# Patient Record
Sex: Male | Born: 1955 | Hispanic: No | Marital: Married | Smoking: Never smoker
Health system: Southern US, Community
[De-identification: ages and names within clinical notes are randomized; demographics above are authoritative.]

## PROBLEM LIST (undated history)

## (undated) DIAGNOSIS — E785 Hyperlipidemia, unspecified: Secondary | ICD-10-CM

## (undated) HISTORY — DX: Hyperlipidemia, unspecified: E78.5

---

## 2001-07-12 ENCOUNTER — Ambulatory Visit (HOSPITAL_BASED_OUTPATIENT_CLINIC_OR_DEPARTMENT_OTHER): Admission: RE | Admit: 2001-07-12 | Discharge: 2001-07-12 | Payer: Self-pay | Admitting: *Deleted

## 2013-06-12 ENCOUNTER — Ambulatory Visit (INDEPENDENT_AMBULATORY_CARE_PROVIDER_SITE_OTHER): Payer: BC Managed Care – PPO | Admitting: Internal Medicine

## 2013-06-12 ENCOUNTER — Encounter: Payer: Self-pay | Admitting: Internal Medicine

## 2013-06-12 VITALS — BP 142/80 | HR 72 | Temp 98.6°F | Ht 70.0 in | Wt 201.0 lb

## 2013-06-12 DIAGNOSIS — S2249XA Multiple fractures of ribs, unspecified side, initial encounter for closed fracture: Secondary | ICD-10-CM

## 2013-06-12 DIAGNOSIS — J9819 Other pulmonary collapse: Secondary | ICD-10-CM

## 2013-06-12 DIAGNOSIS — J9811 Atelectasis: Secondary | ICD-10-CM

## 2013-06-12 MED ORDER — OXYCODONE-ACETAMINOPHEN 10-325 MG PO TABS: 1.0000 | ORAL_TABLET | Freq: Four times a day (QID) | ORAL | Status: DC | PRN

## 2013-06-12 NOTE — Patient Instructions (Signed)
Take enough percocet to make pain tolerable  Please schedule a follow up office visit in 2  weeks, sooner if needed for short of breath or worse pain
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
## 2013-06-28 ENCOUNTER — Ambulatory Visit (INDEPENDENT_AMBULATORY_CARE_PROVIDER_SITE_OTHER)
Admission: RE | Admit: 2013-06-28 | Discharge: 2013-06-28 | Disposition: A | Discharge status: Home / Self Care | Payer: BC Managed Care – PPO | Source: Ambulatory Visit | Attending: Internal Medicine | Admitting: Internal Medicine

## 2013-06-28 ENCOUNTER — Encounter: Payer: Self-pay | Admitting: Internal Medicine

## 2013-06-28 ENCOUNTER — Ambulatory Visit (INDEPENDENT_AMBULATORY_CARE_PROVIDER_SITE_OTHER): Payer: BC Managed Care – PPO | Admitting: Internal Medicine

## 2013-06-28 VITALS — BP 140/84 | HR 92 | Temp 97.9°F | Ht 70.0 in | Wt 198.4 lb

## 2013-06-28 DIAGNOSIS — S2249XA Multiple fractures of ribs, unspecified side, initial encounter for closed fracture: Secondary | ICD-10-CM

## 2013-06-28 MED ORDER — OXYCODONE-ACETAMINOPHEN 10-325 MG PO TABS: 1.0000 | ORAL_TABLET | Freq: Four times a day (QID) | ORAL | Status: DC | PRN
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
## 2013-06-28 NOTE — Patient Instructions (Signed)
Please remember to go to the  x-ray department downstairs for your tests - we will call you with the results when they are available.  Zostrix cream apply four times daily to area where you hurt and gradually reduce to bedtime as you improve and don't need the percocet  Please schedule a follow up office visit in 4 weeks, sooner if needed

## 2013-06-28 NOTE — Progress Notes (Signed)
Quick Note:  Spoke with pt and notified of results per Dr. Wert. Pt verbalized understanding and denied any questions.  ______ 
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
## 2013-07-25 ENCOUNTER — Ambulatory Visit (INDEPENDENT_AMBULATORY_CARE_PROVIDER_SITE_OTHER): Payer: BC Managed Care – PPO | Admitting: Internal Medicine

## 2013-07-25 ENCOUNTER — Encounter: Payer: Self-pay | Admitting: Internal Medicine

## 2013-07-25 ENCOUNTER — Ambulatory Visit (INDEPENDENT_AMBULATORY_CARE_PROVIDER_SITE_OTHER)
Admission: RE | Admit: 2013-07-25 | Discharge: 2013-07-25 | Disposition: A | Discharge status: Home / Self Care | Payer: BC Managed Care – PPO | Source: Ambulatory Visit | Attending: Internal Medicine | Admitting: Internal Medicine

## 2013-07-25 VITALS — BP 136/82 | HR 88 | Temp 98.0°F | Ht 70.0 in | Wt 194.0 lb

## 2013-07-25 DIAGNOSIS — J9819 Other pulmonary collapse: Secondary | ICD-10-CM

## 2013-07-25 DIAGNOSIS — J9811 Atelectasis: Secondary | ICD-10-CM

## 2013-07-25 DIAGNOSIS — S2241XK Multiple fractures of ribs, right side, subsequent encounter for fracture with nonunion: Secondary | ICD-10-CM

## 2013-07-25 DIAGNOSIS — IMO0002 Reserved for concepts with insufficient information to code with codable children: Secondary | ICD-10-CM
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
## 2013-07-25 NOTE — Patient Instructions (Addendum)
Avoid high impact activities, further xrays optional
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
## 2013-07-25 NOTE — Assessment & Plan Note (Signed)
resolved 

## 2014-10-14 DIAGNOSIS — M48061 Spinal stenosis, lumbar region without neurogenic claudication: Secondary | ICD-10-CM | POA: Insufficient documentation

## 2015-01-01 IMAGING — CR DG CHEST 2V
2 series · 2 of 2 positions shown · non-contrast
Comparison: 06/28/2013

CLINICAL DATA: Chest pain

EXAM:
CHEST  2 VIEW

[view not recorded (1 of 2)]
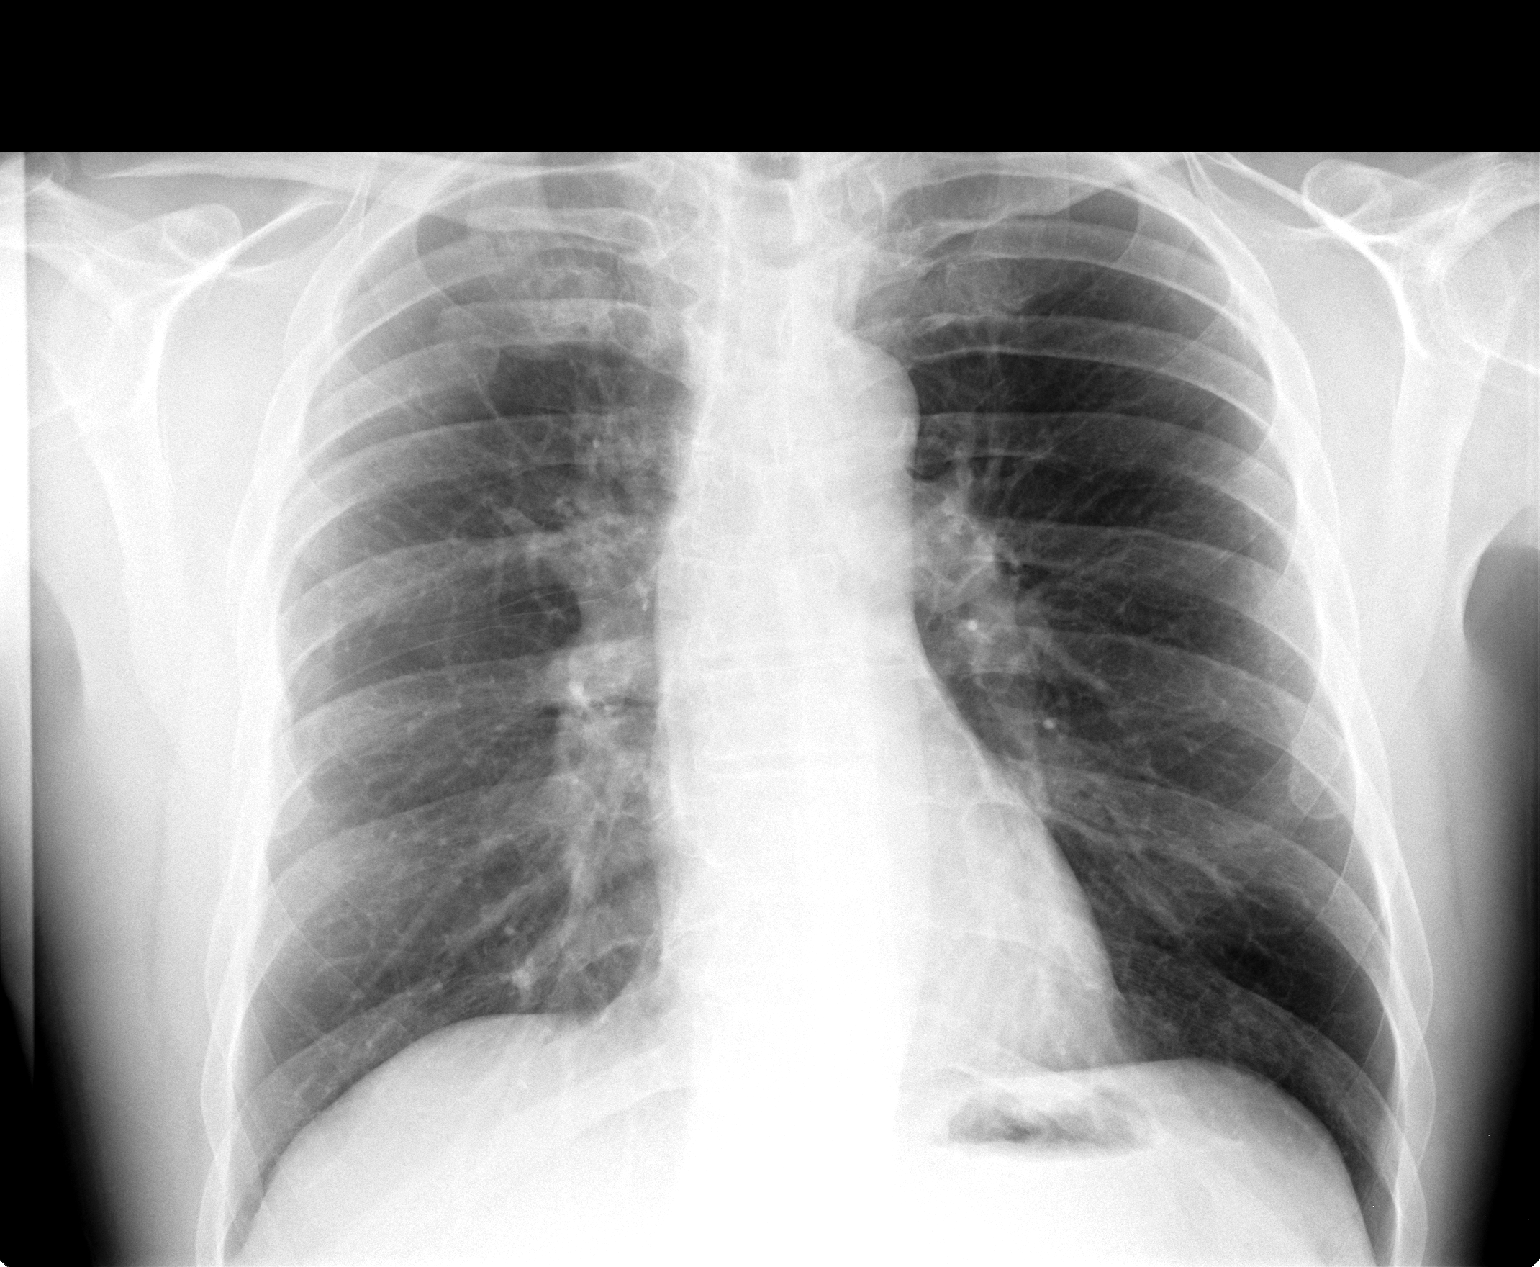

[view not recorded (2 of 2)]
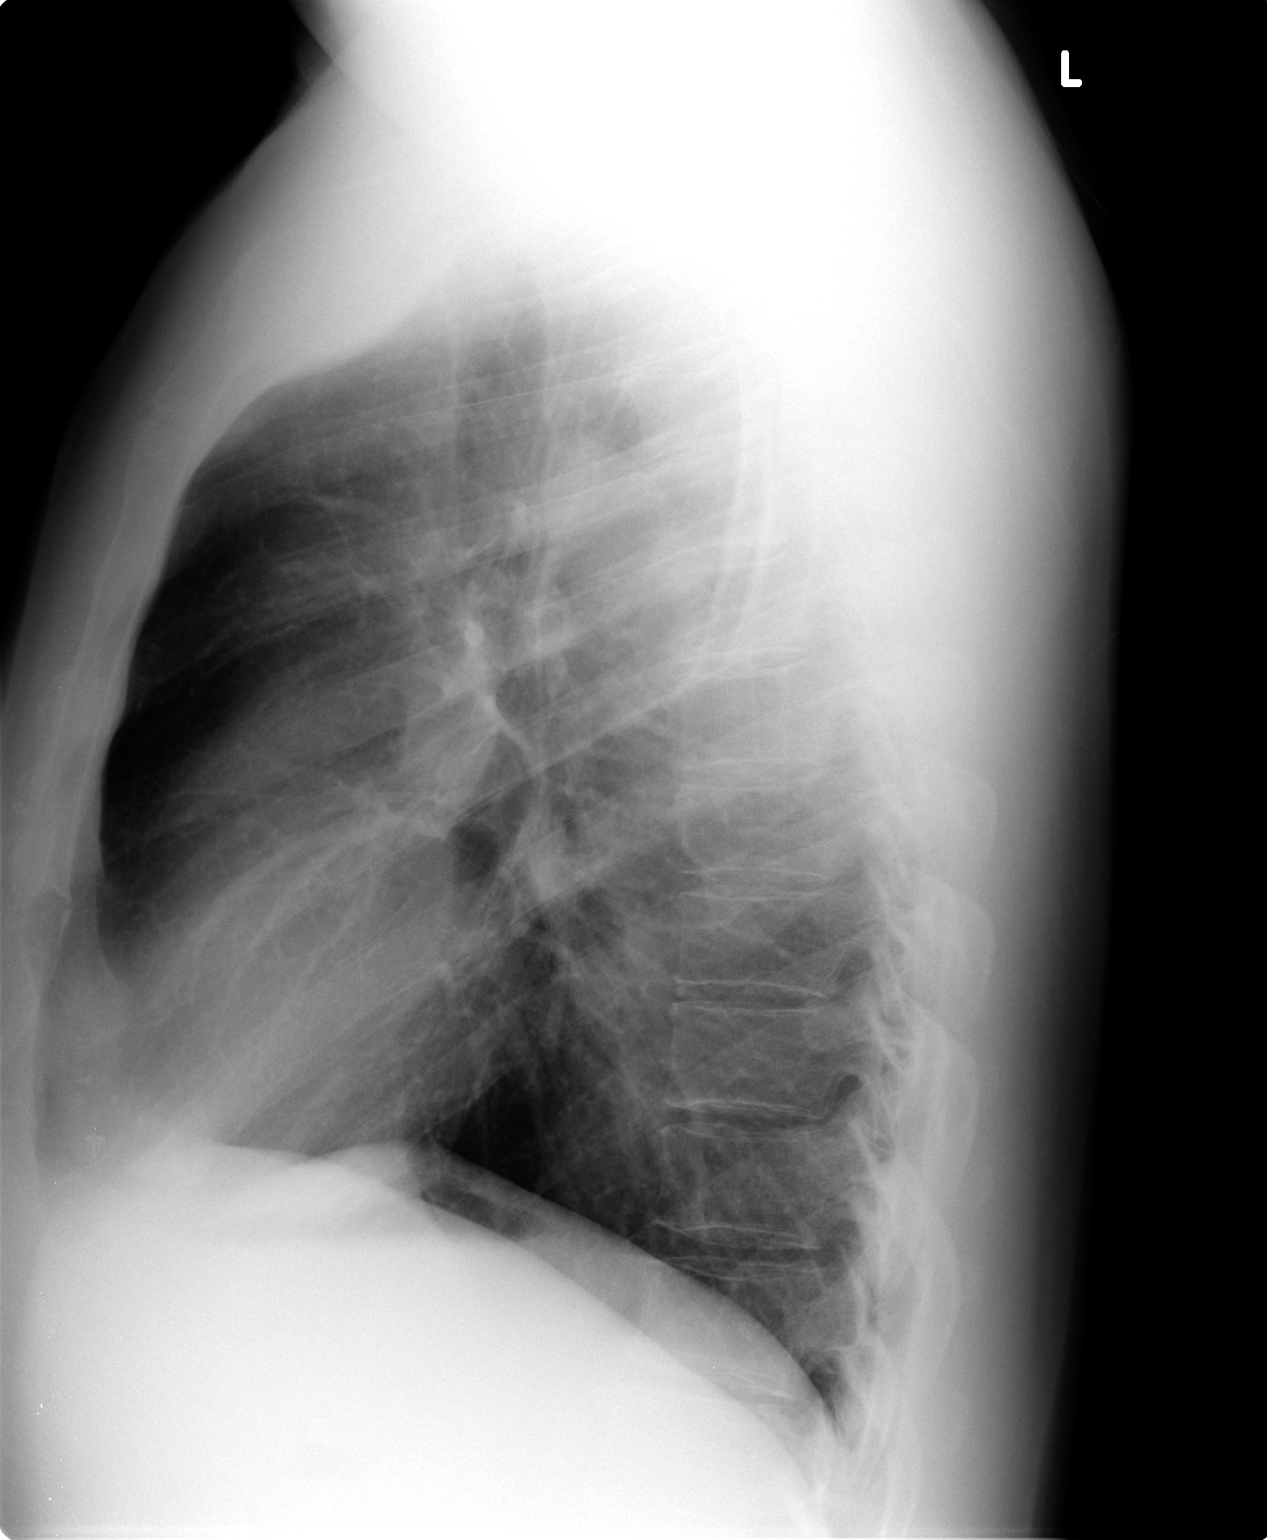

[2 of 2 positions shown; findings below may reference images not displayed]

FINDINGS: Cardiac shadow is within normal limits. The lungs are well aerated
bilaterally. Multiple right rib fractures are again noted. No
pneumothorax is seen. No sizable effusion is noted.
IMPRESSION: Stable right rib fractures without pneumothorax. The previously seen
effusion has resolved.

## 2019-02-02 DIAGNOSIS — F40240 Claustrophobia: Secondary | ICD-10-CM | POA: Insufficient documentation

## 2019-02-02 DIAGNOSIS — E7800 Pure hypercholesterolemia, unspecified: Secondary | ICD-10-CM | POA: Insufficient documentation

## 2019-02-02 DIAGNOSIS — E039 Hypothyroidism, unspecified: Secondary | ICD-10-CM | POA: Insufficient documentation

## 2021-01-15 ENCOUNTER — Other Ambulatory Visit: Payer: Self-pay

## 2021-01-15 ENCOUNTER — Ambulatory Visit
Admission: RE | Admit: 2021-01-15 | Discharge: 2021-01-15 | Disposition: A | Discharge status: Home / Self Care | Payer: Medicare Other | Source: Ambulatory Visit

## 2021-01-15 VITALS — BP 175/90 | HR 79 | Temp 99.1°F | Resp 16

## 2021-01-15 DIAGNOSIS — H938X1 Other specified disorders of right ear: Secondary | ICD-10-CM | POA: Diagnosis not present

## 2021-01-15 NOTE — ED Provider Notes (Signed)
[facility]    CSN: [id number] Arrival date & time: 01/15/21  1148      History   Chief Complaint Chief Complaint  Patient presents with   Appointment    HPI Charles Nguyen is a 65 y.o. male.   Presenting today with several months of intermittent irritation to his right ear canal.  States he had a similar sensation about 5 years ago and when he saw an ear nose and throat for it they plucked 2 hairs from his ear canal and that were irritating the tympanic membrane and the symptoms resolved.  He has been trying peroxide flushes at home with no relief.  States his hearing is intact and unchanged from baseline.  Denies headache, recent illness, drainage from the ear, fever, chills.  Past Medical History:  Diagnosis Date   Hyperlipidemia     Patient Active Problem List   Diagnosis Date Noted   Atelectasis 06/12/2013   Fracture of multiple ribs 06/12/2013    History reviewed. No pertinent surgical history.     Home Medications    Prior to Admission medications   Medication Sig Start Date End Date Taking? Authorizing Provider  Ascorbic Acid (VITAMIN C) 1000 MG tablet Take 1,000 mg by mouth daily.   Yes [provider]  Cholecalciferol (VITAMIN D3) 1.25 MG (50000 UT) TABS Take by mouth.   Yes [provider]  co-enzyme Q-10 30 MG capsule Take 30 mg by mouth 3 (three) times daily.   Yes [provider]  Folic Acid (FOLATE PO) Take by mouth.   Yes [provider]  Multiple Vitamin (MULTIVITAMIN) tablet Take 1 tablet by mouth daily.   Yes [provider]  niacin 500 MG tablet Take 500 mg by mouth at bedtime.   Yes [provider]  Red Yeast Rice 600 MG TABS Take by mouth.   Yes [provider]  saw palmetto 80 MG capsule Take 80 mg by mouth 2 (two) times daily.   Yes [provider]  testosterone enanthate (DELATESTRYL) 200 MG/ML injection Dose unknown   Yes [provider]  thyroid  (ARMOUR) 120 MG tablet Take by mouth daily before breakfast. Dose unknown   Yes [provider]  UNKNOWN TO PATIENT Thyroid med   Yes [provider]  vitamin B-12 (CYANOCOBALAMIN) 500 MCG tablet Take 500 mcg by mouth daily.   Yes [provider]  zinc gluconate 50 MG tablet Take 50 mg by mouth daily.   Yes [provider]   Family History No family history on file.  Social History Social History   Tobacco Use   Smoking status: Never   Smokeless tobacco: Never  Substance Use Topics   Alcohol use: Yes   Drug use: No     Allergies   Patient has no known allergies.   Review of Systems Review of Systems Per HPI  Physical Exam Triage Vital Signs ED Triage Vitals  Enc Vitals Group     BP 01/15/21 1302 (!) 175/90     Pulse Rate 01/15/21 1302 79     Resp 01/15/21 1302 16     Temp 01/15/21 1302 99.1 F (37.3 C)     Temp Source 01/15/21 1302 Oral     SpO2 01/15/21 1302 98 %     Weight --      Height --      Head Circumference --      Peak Flow --      Pain Score 01/15/21  1255 0     Pain Loc --      Pain Edu? --      Excl. in GC? --    No data found.  Updated Vital Signs BP (!) 175/90    Pulse 79    Temp 99.1 F (37.3 C) (Oral)    Resp 16    SpO2 98%   Visual Acuity Right Eye Distance:   Left Eye Distance:   Bilateral Distance:    Right Eye Near:   Left Eye Near:    Bilateral Near:     Physical Exam Vitals and nursing note reviewed.  Constitutional:      Appearance: Normal appearance.  HENT:     Head: Atraumatic.     Right Ear: Tympanic membrane normal.     Left Ear: Tympanic membrane normal.     Ears:     Comments: Mall amount of cerumen debris at the base of right ear canal, no obvious foreign bodies, hair on exam that could be causing irritation otherwise    Nose: Nose normal.     Mouth/Throat:     Mouth: Mucous membranes are moist.  Eyes:     Extraocular Movements: Extraocular movements intact.      Conjunctiva/sclera: Conjunctivae normal.  Cardiovascular:     Rate and Rhythm: Normal rate and regular rhythm.  Pulmonary:     Effort: Pulmonary effort is normal. No respiratory distress.  Musculoskeletal:        General: Normal range of motion.     Cervical back: Normal range of motion and neck supple.  Skin:    General: Skin is warm and dry.  Neurological:     General: No focal deficit present.     Mental Status: He is oriented to person, place, and time.  Psychiatric:        Mood and Affect: Mood normal.        Thought Content: Thought content normal.        Judgment: Judgment normal.   UC Treatments / Results  Labs (all labs ordered are listed, but only abnormal results are displayed) Labs Reviewed - No data to display  EKG   Radiology No results found.  Procedures Procedures (including critical care time)  Medications Ordered in UC Medications - No data to display  Initial Impression / Assessment and Plan / UC Course  I have reviewed the triage vital signs and the nursing notes.  Pertinent labs & imaging results that were available during my care of the patient were reviewed by me and considered in my medical decision making (see chart for details).     Offered warm water lavage to see if we were able to extract any debris from the ear canal, he declines as he has been doing the same at home and will continue to do the same.  No obvious foreign body or anatomic cause of his irritation noted on exam.  Continue lavages to see about removing the remaining wax debris from the canal, follow-up with ENT if not resolving.  Final Clinical Impressions(s) / UC Diagnoses   Final diagnoses:  Irritation of ear, right   Discharge Instructions   None    ED Prescriptions   None    PDMP not reviewed this encounter.   Particia Nearing, [place] 01/15/21 1322

## 2021-01-15 NOTE — ED Triage Notes (Signed)
PT reports right ear irritation, believes a hair is bothering his canal.

## 2023-05-27 ENCOUNTER — Ambulatory Visit (INDEPENDENT_AMBULATORY_CARE_PROVIDER_SITE_OTHER): Admitting: Podiatry

## 2023-05-27 DIAGNOSIS — L600 Ingrowing nail: Secondary | ICD-10-CM | POA: Diagnosis not present

## 2023-05-27 NOTE — Patient Instructions (Signed)

## 2023-05-27 NOTE — Progress Notes (Unsigned)
 Subjective:  Patient ID: Charles Nguyen, male    DOB: 1955-12-27,  MRN: [id number]  Chief Complaint  Patient presents with   Ingrown Toenail    Hallux left - lateral border    68 y.o. male presents with the above complaint.  Patient presents with left hallux lateral border ingrown painful to touch is progressing and worse worse with ambulation or shoe pressure has not seen and was prior to seeing me would like to discuss treatment options for it pain scale 7 out of 10 dull aching nature.  He has not had it removed in the past.   Review of Systems: Negative except as noted in the HPI. Denies N/V/F/Ch.  Past Medical History:  Diagnosis Date   Hyperlipidemia     Current Outpatient Medications:    Ascorbic Acid (VITAMIN C) 1000 MG tablet, Take 1,000 mg by mouth daily., Disp: , Rfl:    Cholecalciferol (VITAMIN D3) 1.25 MG (50000 UT) TABS, Take by mouth., Disp: , Rfl:    co-enzyme Q-10 30 MG capsule, Take 30 mg by mouth 3 (three) times daily., Disp: , Rfl:    Folic Acid (FOLATE PO), Take by mouth., Disp: , Rfl:    Multiple Vitamin (MULTIVITAMIN) tablet, Take 1 tablet by mouth daily., Disp: , Rfl:    niacin 500 MG tablet, Take 500 mg by mouth at bedtime., Disp: , Rfl:    Red Yeast Rice 600 MG TABS, Take by mouth., Disp: , Rfl:    saw palmetto 80 MG capsule, Take 80 mg by mouth 2 (two) times daily., Disp: , Rfl:    testosterone enanthate (DELATESTRYL) 200 MG/ML injection, Dose unknown, Disp: , Rfl:    thyroid (ARMOUR) 120 MG tablet, Take by mouth daily before breakfast. Dose unknown, Disp: , Rfl:    UNKNOWN TO PATIENT, Thyroid med, Disp: , Rfl:    vitamin B-12 (CYANOCOBALAMIN) 500 MCG tablet, Take 500 mcg by mouth daily., Disp: , Rfl:    zinc gluconate 50 MG tablet, Take 50 mg by mouth daily., Disp: , Rfl:   Social History   Tobacco Use  Smoking Status Never  Smokeless Tobacco Never    No Known Allergies Objective:  There were no vitals filed for this visit. There is no height or  weight on file to calculate BMI. Constitutional Well developed. Well nourished.  Vascular Dorsalis pedis pulses palpable bilaterally. Posterior tibial pulses palpable bilaterally. Capillary refill normal to all digits.  No cyanosis or clubbing noted. Pedal hair growth normal.  Neurologic Normal speech. Oriented to person, place, and time. Epicritic sensation to light touch grossly present bilaterally.  Dermatologic Painful ingrowing nail at lateral nail borders of the hallux nail left. No other open wounds. No skin lesions.  Orthopedic: Normal joint ROM without pain or crepitus bilaterally. No visible deformities. No bony tenderness.   Radiographs: None Assessment:  No diagnosis found. Plan:  Patient was evaluated and treated and all questions answered.  Ingrown Nail, left -Patient elects to proceed with minor surgery to remove ingrown toenail removal today. Consent reviewed and signed by patient. -Ingrown nail excised. See procedure note. -Educated on post-procedure care including soaking. Written instructions provided and reviewed. -Patient to follow up in 2 weeks for nail check.  Procedure: Excision of Ingrown Toenail Location: Left 1st toe lateral nail borders. Anesthesia: Lidocaine 1% plain; 1.5 mL and Marcaine 0.5% plain; 1.5 mL, digital block. Skin Prep: Betadine. Dressing: Silvadene; telfa; dry, sterile, compression dressing. Technique: Following skin prep, the toe was exsanguinated and a tourniquet  was secured at the base of the toe. The affected nail border was freed, split with a nail splitter, and excised. Chemical matrixectomy was then performed with phenol and irrigated out with alcohol. The tourniquet was then removed and sterile dressing applied. Disposition: Patient tolerated procedure well. Patient to return in 2 weeks for follow-up.   No follow-ups on file.
